# Patient Record
Sex: Male | Born: 1935 | Race: White | Hispanic: No | Marital: Married
Health system: Southern US, Community
[De-identification: ages and names within clinical notes are randomized; demographics above are authoritative.]

---

## 2004-12-13 ENCOUNTER — Ambulatory Visit: Payer: Self-pay | Admitting: Unknown Physician Specialty

## 2005-07-29 ENCOUNTER — Ambulatory Visit: Payer: Self-pay | Admitting: Ophthalmology

## 2013-01-03 ENCOUNTER — Ambulatory Visit: Payer: Self-pay | Admitting: Specialist

## 2013-01-03 LAB — BASIC METABOLIC PANEL
Anion Gap: 5 — ABNORMAL LOW (ref 7–16)
BUN: 18 mg/dL (ref 7–18)
Chloride: 105 mmol/L (ref 98–107)
Co2: 29 mmol/L (ref 21–32)
EGFR (Non-African Amer.): >60
Osmolality: 279 (ref 275–301)
Sodium: 139 mmol/L (ref 136–145)

## 2013-01-03 LAB — APTT: Activated PTT: 30.6 secs (ref 23.6–35.9)

## 2013-01-03 LAB — CBC
HCT: 39.3 % — ABNORMAL LOW (ref 40.0–52.0)
HGB: 13.1 g/dL (ref 13.0–18.0)
MCHC: 33.3 g/dL (ref 32.0–36.0)
Platelet: 174 10*3/uL (ref 150–440)
RBC: 4.22 10*6/uL — ABNORMAL LOW (ref 4.40–5.90)
RDW: 13.2 % (ref 11.5–14.5)
WBC: 6.8 10*3/uL (ref 3.8–10.6)

## 2013-01-03 LAB — URINALYSIS, COMPLETE
Bacteria: NONE SEEN
Blood: NEGATIVE
Ketone: NEGATIVE
Leukocyte Esterase: NEGATIVE
Nitrite: NEGATIVE
Protein: NEGATIVE
RBC,UR: <1 /HPF (ref 0–5)
Squamous Epithelial: <1
WBC UR: 1 /HPF (ref 0–5)

## 2013-01-03 LAB — PROTIME-INR
INR: 1
Prothrombin Time: 13.1 secs (ref 11.5–14.7)

## 2013-01-19 ENCOUNTER — Inpatient Hospital Stay: Payer: Self-pay | Admitting: Specialist

## 2013-01-20 LAB — CBC WITH DIFFERENTIAL/PLATELET
Basophil %: 0.2 %
Eosinophil #: 0 10*3/uL (ref 0.0–0.7)
Eosinophil %: 0.1 %
HCT: 34.9 % — ABNORMAL LOW (ref 40.0–52.0)
HGB: 12.1 g/dL — ABNORMAL LOW (ref 13.0–18.0)
Lymphocyte #: 0.8 10*3/uL — ABNORMAL LOW (ref 1.0–3.6)
Lymphocyte %: 5.8 %
MCH: 31.9 pg (ref 26.0–34.0)
MCV: 92 fL (ref 80–100)
Monocyte #: 1.3 x10 3/mm — ABNORMAL HIGH (ref 0.2–1.0)
Monocyte %: 9.3 %
Neutrophil #: 11.7 10*3/uL — ABNORMAL HIGH (ref 1.4–6.5)
Neutrophil %: 84.6 %
RBC: 3.8 10*6/uL — ABNORMAL LOW (ref 4.40–5.90)
RDW: 12.9 % (ref 11.5–14.5)
WBC: 13.9 10*3/uL — ABNORMAL HIGH (ref 3.8–10.6)

## 2013-01-20 LAB — BASIC METABOLIC PANEL
Anion Gap: 6 — ABNORMAL LOW (ref 7–16)
BUN: 13 mg/dL (ref 7–18)
Calcium, Total: 8.3 mg/dL — ABNORMAL LOW (ref 8.5–10.1)
Co2: 27 mmol/L (ref 21–32)
Creatinine: 0.89 mg/dL (ref 0.60–1.30)
EGFR (Non-African Amer.): >60
Glucose: 138 mg/dL — ABNORMAL HIGH (ref 65–99)
Osmolality: 271 (ref 275–301)
Sodium: 134 mmol/L — ABNORMAL LOW (ref 136–145)

## 2013-01-21 LAB — BASIC METABOLIC PANEL
Anion Gap: 8 (ref 7–16)
BUN: 13 mg/dL (ref 7–18)
Calcium, Total: 8.9 mg/dL (ref 8.5–10.1)
Chloride: 100 mmol/L (ref 98–107)
Co2: 27 mmol/L (ref 21–32)
Creatinine: 0.94 mg/dL (ref 0.60–1.30)
EGFR (African American): >60
Glucose: 96 mg/dL (ref 65–99)
Osmolality: 270 (ref 275–301)
Potassium: 3.8 mmol/L (ref 3.5–5.1)
Sodium: 135 mmol/L — ABNORMAL LOW (ref 136–145)

## 2013-01-22 LAB — BASIC METABOLIC PANEL
BUN: 11 mg/dL (ref 7–18)
Chloride: 104 mmol/L (ref 98–107)
Co2: 25 mmol/L (ref 21–32)
Creatinine: 0.89 mg/dL (ref 0.60–1.30)
EGFR (African American): >60
Potassium: 3.7 mmol/L (ref 3.5–5.1)
Sodium: 137 mmol/L (ref 136–145)

## 2013-01-22 LAB — CBC WITH DIFFERENTIAL/PLATELET
Basophil #: 0.1 10*3/uL (ref 0.0–0.1)
Basophil %: 0.8 %
MCH: 31.7 pg (ref 26.0–34.0)
MCHC: 34.4 g/dL (ref 32.0–36.0)
MCV: 92 fL (ref 80–100)
Monocyte %: 10.2 %
Neutrophil %: 72.6 %
RDW: 13.4 % (ref 11.5–14.5)
WBC: 10.6 10*3/uL (ref 3.8–10.6)

## 2013-01-25 LAB — PATHOLOGY REPORT

## 2013-05-04 ENCOUNTER — Ambulatory Visit: Payer: Self-pay | Admitting: Specialist

## 2013-05-04 LAB — PROTIME-INR: Prothrombin Time: 13.1 secs (ref 11.5–14.7)

## 2013-05-04 LAB — BASIC METABOLIC PANEL
Calcium, Total: 9.1 mg/dL (ref 8.5–10.1)
Chloride: 106 mmol/L (ref 98–107)
Creatinine: 0.86 mg/dL (ref 0.60–1.30)
EGFR (African American): >60
EGFR (Non-African Amer.): >60
Glucose: 91 mg/dL (ref 65–99)
Osmolality: 276 (ref 275–301)
Sodium: 138 mmol/L (ref 136–145)

## 2013-05-04 LAB — URINALYSIS, COMPLETE
Bacteria: NONE SEEN
Blood: NEGATIVE
Glucose,UR: NEGATIVE mg/dL (ref 0–75)
Ketone: NEGATIVE
Leukocyte Esterase: NEGATIVE
Nitrite: NEGATIVE
Specific Gravity: 1.014 (ref 1.003–1.030)
WBC UR: <1 /HPF (ref 0–5)

## 2013-05-04 LAB — CBC
HCT: 39.4 % — ABNORMAL LOW (ref 40.0–52.0)
HGB: 13.5 g/dL (ref 13.0–18.0)
MCHC: 34.2 g/dL (ref 32.0–36.0)
Platelet: 187 10*3/uL (ref 150–440)

## 2013-05-05 ENCOUNTER — Ambulatory Visit: Payer: Self-pay | Admitting: Specialist

## 2013-05-05 LAB — MRSA PCR SCREENING

## 2013-05-06 LAB — MRSA PCR SCREENING

## 2013-05-10 ENCOUNTER — Inpatient Hospital Stay: Payer: Self-pay | Admitting: Specialist

## 2013-05-11 LAB — CBC WITH DIFFERENTIAL/PLATELET
Basophil #: 0.1 10*3/uL (ref 0.0–0.1)
Basophil %: 0.4 %
Eosinophil #: 0.2 10*3/uL (ref 0.0–0.7)
Eosinophil %: 1.4 %
HCT: 30.5 % — ABNORMAL LOW (ref 40.0–52.0)
HGB: 10.4 g/dL — ABNORMAL LOW (ref 13.0–18.0)
Lymphocyte #: 0.9 10*3/uL — ABNORMAL LOW (ref 1.0–3.6)
MCH: 31.1 pg (ref 26.0–34.0)
MCV: 91 fL (ref 80–100)
Monocyte #: 1 x10 3/mm (ref 0.2–1.0)
Neutrophil #: 10.8 10*3/uL — ABNORMAL HIGH (ref 1.4–6.5)
Neutrophil %: 83.5 %
Platelet: 139 10*3/uL — ABNORMAL LOW (ref 150–440)
RBC: 3.36 10*6/uL — ABNORMAL LOW (ref 4.40–5.90)
RDW: 13.7 % (ref 11.5–14.5)

## 2013-05-11 LAB — BASIC METABOLIC PANEL
Anion Gap: 5 — ABNORMAL LOW (ref 7–16)
BUN: 13 mg/dL (ref 7–18)
Calcium, Total: 8.1 mg/dL — ABNORMAL LOW (ref 8.5–10.1)
Chloride: 101 mmol/L (ref 98–107)
Co2: 26 mmol/L (ref 21–32)
Creatinine: 0.91 mg/dL (ref 0.60–1.30)
EGFR (Non-African Amer.): >60
Osmolality: 267 (ref 275–301)
Potassium: 3.9 mmol/L (ref 3.5–5.1)
Sodium: 132 mmol/L — ABNORMAL LOW (ref 136–145)

## 2013-05-12 DIAGNOSIS — R000 Tachycardia, unspecified: Secondary | ICD-10-CM

## 2013-05-12 LAB — URINALYSIS, COMPLETE
Bacteria: NONE SEEN
Bilirubin,UR: NEGATIVE
Glucose,UR: NEGATIVE mg/dL (ref 0–75)
Leukocyte Esterase: NEGATIVE
Protein: 30
Specific Gravity: 1.012 (ref 1.003–1.030)
WBC UR: 3 /HPF (ref 0–5)

## 2013-05-12 LAB — TSH: Thyroid Stimulating Horm: 1.44 u[IU]/mL

## 2013-05-12 LAB — PATHOLOGY REPORT

## 2013-05-12 LAB — HEMOGLOBIN: HGB: 11.3 g/dL — ABNORMAL LOW (ref 13.0–18.0)

## 2013-05-13 LAB — BASIC METABOLIC PANEL
Anion Gap: 7 (ref 7–16)
BUN: 10 mg/dL (ref 7–18)
Calcium, Total: 8 mg/dL — ABNORMAL LOW (ref 8.5–10.1)
Co2: 24 mmol/L (ref 21–32)
Creatinine: 0.89 mg/dL (ref 0.60–1.30)
EGFR (African American): >60
EGFR (Non-African Amer.): >60
Glucose: 108 mg/dL — ABNORMAL HIGH (ref 65–99)
Potassium: 3.5 mmol/L (ref 3.5–5.1)
Sodium: 140 mmol/L (ref 136–145)

## 2014-09-20 ENCOUNTER — Encounter
Admit: 2014-09-20 | Disposition: A | Discharge status: Home / Self Care | Payer: Self-pay | Attending: Neurology | Admitting: Neurology

## 2014-10-20 ENCOUNTER — Encounter
Admit: 2014-10-20 | Disposition: A | Discharge status: Home / Self Care | Payer: Self-pay | Attending: Neurology | Admitting: Neurology

## 2014-11-10 NOTE — Discharge Summary (Signed)
PATIENT NAME:  Chad Adams, Kolsen H MR#:  [id number] DATE OF BIRTH:  1926/06/16  DATE OF ADMISSION:  01/19/2013 DATE OF DISCHARGE:  01/24/2013  FINAL DIAGNOSES: 1.  Advanced osteoarthritis, right hip. 2.  High cholesterol. 3.  Hearing problems.  4.  History of prostate cancer.   OPERATION: On 01/19/2013, cementless DePuy AML right total hip replacement.   COMPLICATIONS: None.   CONSULTATIONS: [facility] document.   DISCHARGE MEDICATIONS: 1.  Lipitor 10 mg daily. 2.  Neurontin 400 mg b.i.d.  3.  Enteric-coated aspirin 1 p.o. b.i.d. 4.  Iron 1 p.o. daily.  5.  Triamcinolone cream to legs and groin p.r.n.   HISTORY OF PRESENT ILLNESS: The patient is an 81 year old male who developed progressive severe arthritis of the right hip. He no longer received relief with anti-inflammatory medication and pain pills. He had tried rest, exercise and a cane. He had trouble with daily activities and sleep. He wished to proceed with right total hip replacement after thorough discussion of the operative procedure and risks and benefits with the patient and his wife.   DIAGNOSTICS:  X-ray showed severe arthritis of the hip with sclerosis and cyst formation.   PAST MEDICAL HISTORY: As above.   MEDICATIONS: As above.   ALLERGIES: NO KNOWN DRUG ALLERGIES.   REVIEW OF SYSTEMS:  Unremarkable.   FAMILY HISTORY: Unremarkable.   SOCIAL HISTORY: The patient does not smoke or drink. He lives at home with his wife.  PHYSICAL EXAMINATION: The patient's left hip had good motion with mild decrease in rotation. There was minimal pain. The right hip had severe pain with motion. There was limited internal rotation and about 20 degrees external rotation. Leg lengths were essentially equal. Neurovascular status was good distally.   LABORATORY DATA: On admission was satisfactory.   HOSPITAL COURSE: On 01/19/2013, the patient underwent right total hip replacement. He did well postoperatively. He had a little bit of  lightheadedness on the second postop day and was seen by the medical consult. He was given some IV fluids. His hemoglobin remained quite stable and was 11.4 on the third postop day. The patient made good progress with PT. His discharge was delayed by the insurance company being closed. He is stable and ready for skilled nursing transfer. He will be seen in my office in 2 weeks for exam and x-rays and staple removal.   01/25/13  Addendum:  [employer] insurance would not approve skilled nursing care for this patient even though he is 81 years old.  He is to be discharged to home with home health PT.     ____________________________ Valinda HoarHoward E. Alania Overholt, MD hem:sb D: 01/24/2013 13:25:00 ET T: 01/24/2013 14:02:49 ET JOB#: [id number]  cc: Valinda HoarHoward E. Deniss Wormley, MD, <Dictator> Jorje GuildGlenn R. Beckey DowningWillett, MD Valinda HoarHOWARD E Jasai Sorg MD ELECTRONICALLY SIGNED 01/25/2013 15:02

## 2014-11-10 NOTE — Consult Note (Signed)
PATIENT NAME:  Chad Adams, Chad Adams MR#:  [id number] DATE OF BIRTH:  10/20/1925  DATE OF CONSULTATION:  05/12/2013  REFERRING PHYSICIAN:  Dr. Hyacinth Meeker CONSULTING PHYSICIAN:  Gilmar Bua P. Juliene Pina, MD  REASON FOR CONSULTATION: Presyncope with tachycardia.   PRIMARY CARE PHYSICIAN: Barry Brunner, MD  PRIMARY CARDIOLOGIST:  Harold Hedge, MD  IMPRESSION: 1.  Presyncope with nausea upon standing up.  2.  Sinus tachycardia.  3.  Postoperative day number 2 status post total hip replacement.  4.  History of hyperlipidemia.   HISTORY OF PRESENT ILLNESS: This is a very pleasant 81 year old male with a history of osteoarthritis and hyperlipidemia who is postop day number 2 for a total hip replacement. According to the patient, yesterday he walked with physical therapy and did well. He walked from his bed to the door. However, this morning when he got up to walk with physical therapy he got to the door and felt very nauseous and thought he was going to pass out. They took him back to his chair where he felt better, but it is noted that he has also been tachycardiac. It also has been noted that yesterday at approximately midnight, on the 22nd, he had a fever. However, since then, he has not had a fever. The patient denies any chest pain, shortness of breath.   REVIEW OF SYSTEMS: CONSTITUTIONAL: A fever, as mentioned above, but no chills, weight loss, weight gain. He did have dizziness today, but not prior. EYES: No blurred or double vision or swelling.  ENT: No tinnitus, difficulty swallowing.  CARDIOVASCULAR: No chest pain, orthopnea, paroxysmal dyspnea, shortness of breath or arrhythmia.  RESPIRATORY: No cough, wheezing or hemoptysis. No shortness of breath.  GASTROINTESTINAL: He felt nauseous this morning with the episode, but no vomiting, diarrhea, abdominal pain, melena or ulcers.  GENITOURINARY: No dysuria or hematuria.  ENDOCRINE: No polyuria, polydipsia, heat or cold intolerance.  MUSCULOSKELETAL: He does  have arthritis.  NEUROLOGIC: No history of CVA, TIA, seizure or stroke. No numbness or tingling.  PSYCH: No anxiety or depression.  SKIN: No rash or lesions.  PAST MEDICAL HISTORY:  1.  Mild COPD. 2.  Hyperlipidemia.  3.  BPH.  4.  Prostate cancer.  5.  Lung nodule.   ALLERGIES: No known drug allergies.   MEDICATIONS: 1.  Lipitor 20 mg daily.  2.  Aspirin 81 mg daily.   PAST SURGICAL HISTORY:  1.  Right hip and now left hip replacement.  2.  Pilonidal cyst. 3.  Right hand surgery.  4.  Tonsillectomy.   FAMILY HISTORY: Positive for breast cancer and throat cancer as well as hypertension.  SOCIAL HISTORY: The patient used to smoke but has not smoked for over 40 years. No alcohol or IV drug use. He lives with his wife.   PHYSICAL EXAMINATION: VITAL SIGNS: Temperature 98.1, pulse 113 to 120, respirations 18, blood pressure 155 to 163 over 75 to 84, 95% on room air.  GENERAL: The patient is alert and oriented, not in acute distress.  HEENT: Head is atraumatic. Sclerae anicteric. Mucous membranes are moist. Oropharynx is clear. He does wearing aids. NECK: Supple without JVD, carotid bruit or enlarged thyroid. HEART: Tachycardia without murmur, gallops or rubs. PMI is nondisplaced.   LUNGS: Clear to auscultation without crackles, rales, rhonchi or wheezing. Normal to percussion. Normal respiratory effort.  ABDOMEN: Bowel sounds are positive, nontender and nondistended. No hepatosplenomegaly.  EXTREMITIES: No clubbing, cyanosis or edema. His feet are cold, however, he has good pulses bilaterally.  MUSCULOSKELETAL:  The patient is able to move all extremities. BACK: No CVA or vertebral tenderness.   LABORATORY AND DIAGNOSTICS: White blood cells 13, hemoglobin 10.4, hematocrit 31, platelets 139. Sodium 132, potassium 2.9, chloride 101, bicarb 26, BUN 13, creatinine 0.91, glucose 131 and calcium is 8.1.   EKG performed today showed sinus tachycardia with a heart rate of 114.    ASSESSMENT AND PLAN: This is an 81 year old male who is postop day number 2 who describes presyncope.  1.  Presyncope. Possibly related to his tachycardia with possible dehydration or related to the spinal anesthesia or related to the pain medications. For the presyncope, I recommend trying gentle bolus with 250 mL. Will increase the IV fluids to 125 and change it to normal saline. Place the patient on off-unit telemetry. He does not have any history of coronary artery disease or congestive heart failure so at this time I will not obtain an echocardiogram. However, if he continues to be tachycardiac or have presyncopal symptoms, we may consider this. I will also check orthostatics.  2.  Tachycardia. Likely secondary to hypovolemia with some intravascular volume depletion versus spinal anesthesia. Will go ahead and try IV fluids to see if this helps control his pain, if the patient is having any pain, but currently he is not complaining of any pain and monitor closely and check a TSH.  3.  Elevated blood pressure without history of hypertension. Per the wife, the patient has always had hypotension. Again, the patient is not complaining of pain but this could be elevated from pain or from the anesthesia. Will need to monitor closely.  4.  Fever. The patient had a fever at midnight on October 22nd. No fever since then. However, due to his tachycardia, we will go ahead and check for any occult infection with a UA and chest x-ray.  5.  Hyperlipidemia. Will continue Lipitor.   The patient is FULL CODE status.   Thank you for allowing us to participate in the care of this patient. We will continue to follow.  TIME SPENT ON CONSULTATION: 50 minutes. ____________________________ Janyth ContesSital P. Juliene PinaMody, MD spm:sb D: 05/12/2013 10:52:55 ET T: 05/12/2013 11:06:54 ET JOB#: [id number]  cc: Inaya Gillham P. Juliene PinaMody, MD, <Dictator> Janyth ContesSITAL P Moksha Dorgan MD ELECTRONICALLY SIGNED 05/12/2013 11:37

## 2014-11-10 NOTE — Op Note (Signed)
PATIENT NAME:  Chad Adams, Chad Adams MR#:  [id number] DATE OF BIRTH:  1926-04-23  DATE OF PROCEDURE:  05/10/2013  PREOPERATIVE DIAGNOSIS: Advanced osteoarthritis, left hip.  POSTOPERATIVE DIAGNOSIS: Advanced osteoarthritis, left hip.  OPERATION: DePuy cementless AML total hip replacement (16.5 mm femoral stem, 56 mm series 300 cup with a 36 mm +4 liner with 10 degree lip, 36 mm chrome cobalt head, +5 mm neck length).  SURGEON: Deeann SaintHoward Maleeyah Mccaughey, MD.  ASSISTANT: Dr. Martha ClanKrasinski.  ANESTHESIA: Spinal.  COMPLICATIONS: None.  DRAINS: Two Autovacs.   ESTIMATED BLOOD LOSS: 500 mL.  REPLACEMENTS: None.  OPERATIVE PROCEDURE: The patient was brought to the operating room where he underwent satisfactory spinal anesthesia and was placed in the right lateral decubitus position on the operating room table and padded appropriately. Preoperative leg lengths appeared to be fairly equal. The left hip was prepped and draped in a sterile fashion and a posterolateral incision made. Dissection was carried out sharply through subcutaneous tissue and fascia and a Charnley retractor inserted. Soft tissue dissection was carried out and the piriformis was tagged. Posterior capsule was opened in a T fashion and tagged. A Steinmann pin was placed in the pelvis above the hip and bent and a suture placed on the greater trochanter to mark leg lengths. The hip was then dislocated and the femoral neck amputated with an oscillating saw. The protractors were inserted to expose the acetabulum and labrum was removed. He had extremely thick, robust labrum. The soft tissues were removed from the fossa. The fossa was sequentially reamed from 51 to 55 mm. This brought us quite close to the inner table. The 56 mm trial liner was inserted and fit snuggly. A +4 neutral liner was inserted.   The femoral canal was then opened with a canal finder and reamed sequentially to 16 mm. It was then rasped starting with 13.5 narrow going up to 16.5 narrow  rasp. The trial reduction was then carried out using +1.5 neck length with a 36 mm head. This allowed a little bit too much pistoning and it was slightly unstable. The acetabular liner was changed to one with a 10 degree lip posteriorly and a +5 neck length was used and this provided much better stability and alignment. The Steinmann pin showed that the length was excellent. The remaining trials were removed and after irrigation, the hole eliminator was inserted into the cup. A +4 liner with a 10 mm lip was placed with the lip posteriorly and superiorly. A 16.5 mm narrow femoral stem was inserted and was quite snug. A 36 mm head with a +5 neck length was attached and the hip was reduced. Stability was excellent. Leg lengths were excellent. The Steinmann pin was removed. The wound was irrigated. The capsule was closed with #2 Ticron. The piriformis was repaired with similar suture. The capsule was then closed with #2 Manson AllanQuill over an Autovac drain and the subcutaneous tissue was close with 0 Quill over another Autovac drain with irrigation at each level. The patient's skin was closed with staples.TENS pads were applied. A dry sterile dressing was applied and the Autovac was activated. The patient was carefully transferred into the supine position and then transferred to his hospital bed. The hip was stable and leg lengths were excellent. He was taken to recovery in good condition.   ____________________________ Valinda HoarHoward E. Skylur Fuston, MD hem:aw D: 05/10/2013 10:31:24 ET T: 05/10/2013 10:45:18 ET JOB#: [id number]  cc: Valinda HoarHoward E. Torina Ey, MD, <Dictator> Valinda HoarHOWARD E Hadyn Blanck MD ELECTRONICALLY SIGNED 05/10/2013 11:59

## 2014-11-10 NOTE — Op Note (Signed)
PATIENT NAME:  Chad Adams, Chad Adams MR#:  [id number] DATE OF BIRTH:  04-03-1926  DATE OF PROCEDURE:  01/19/2013  PREOPERATIVE DIAGNOSIS: Advanced osteoarthritis, right hip.   POSTOPERATIVE DIAGNOSIS: Advanced osteoarthritis, right hip.   PROCEDURE PERFORMED: Cementless right total hip replacement with DePuy AML prosthesis (15 mm narrow femoral stem, 52 mm Pinnacle 300 series cup, neutral +4 polyethylene liner, 36 mm head with +1.5 mm neck length).   SURGEON: Valinda HoarHoward E. Djuan Talton, MD   ASSISTANT: Kathreen DevoidKevin L. Krasinski, MD   ANESTHESIA: Spinal.   COMPLICATIONS: None.   DRAINS: Two Autovacs.   ESTIMATED BLOOD LOSS: 250 mL.  REPLACED: None.   DESCRIPTION OF PROCEDURE: The patient was brought to the Operating Room where he underwent satisfactory spinal anesthesia and was placed in the left lateral decubitus position and padded appropriately on the operating room table. The right hip was prepped and draped in sterile fashion. A posterior lateral incision was made and dissection carried out sharply through subcutaneous tissue and fascia. A Charnley retractor was inserted. The short external rotators were divided and tagged, and the posterior capsule was divided and tagged. The femoral head was dislocated and the centering hole made at the top of the neck. A canal finder was introduced, and the canal was then reamed to 14.5 mm.  The femoral head was then amputated with an oscillating saw, and the canal was packed with vaginal packing to reduce bleeding. The femur was retracted out of the way, and the acetabulum was debrided. The patient had a very thick extensive labrum which was removed. The soft tissue was cauterized and removed from the fovea. The acetabulum was then reamed sequentially up to 55 mm.  A 56 mm trial cup was inserted and fit very snugly in good position. This was removed, and the acetabulum was irrigated, and a 56 mm Pinnacle 3-spike 300 series cup was inserted in approximately 45 degrees of  abduction and 20 degrees of anteversion. This fit very snugly and bottomed out nicely. A trial neutral +4 liner was inserted. The femoral canal was then rasped up to 15 mm and a calcar reamer used to smooth off the neck calcar. A trial reduction was carried out with a 36 mm +1.5 neck length head. This reduced well. There was no pistoning. The leg lengths were equal. Prior to dislocating the original femoral head, a Steinmann pin had been placed in the acetabulum and bent over to the greater trochanter and a suture placed to lock the original length in place. The Steinmann pin was then used to ascertain that the leg lengths remained equal. The trials were removed. The wound was irrigated. A hole eliminator was placed in the cup, and a neutral +4 polyethylene liner was inserted and seated snugly. A 15 mm narrow AML stem was then inserted snugly. The trial reduction was again carried out with a 36 mm head and it again seemed appropriate. This was removed, and the 36 mm metal head with a +1.5 neck length was inserted. The hip was reduced for a final time, and he had excellent range of motion with good stability. There was no pistoning.  Leg lengths were good. The wound was irrigated. The posterior capsule was closed with #2 Ticron. The short external rotators were repaired with the same suture.  After irrigation, the capsule was closed with #2 Quill suture over an Autovac drain, and the subcutaneous tissue was closed with 0 Quill over another Autovac drain. The skin was closed with staples, and a dry sterile  dressing was applied. The Autovac was hooked up, and the patient was transferred to his hospital bed. The hip was stable with good orientation and good leg length. He was taken to recovery in good condition.  ____________________________ Valinda Hoar, MD hem:cb D: 01/19/2013 15:50:09 ET T: 01/19/2013 20:18:39 ET JOB#: [id number]  cc: Valinda Hoar, MD, <Dictator> Valinda Hoar MD ELECTRONICALLY SIGNED  01/20/2013 7:45

## 2014-11-10 NOTE — Discharge Summary (Signed)
PATIENT NAME:  Chad Adams, Chad Adams MR#:  [id number] DATE OF BIRTH:  [birth year]  DATE OF ADMISSION:  05/10/2013 DATE OF DISCHARGE:  05/14/2013  FINAL DIAGNOSES:  1.  Advanced osteoarthritis of the left hip. 2.  High cholesterol. 3.  Hearing problems.   OPERATION: 05/10/13, cementless AML left total hip replacement.   COMPLICATIONS: None.   CONSULTATION: [facility] doc for tachycardia.   DISCHARGE MEDICATIONS: Mobic 50 mg daily, Lipitor 10 mg daily, enteric-coated aspirin one p.o. b.i.d., Norco 5/325 mg q.6 hours p.r.n. pain.   HISTORY OF PRESENT ILLNESS: The patient is an 81 year old male with advanced osteoarthritis of the left hip, which bothers him constantly and has constant pain. He is unable to sleep well and has difficulty with daily activities. He has failed treatment with NSAIDs and exercises after years of treatment. He had a right total hip replacement in July, which has greatly relieved his  pain. He is eager to have the left one replaced. The risks and benefits were discussed with the patient and his wife. The x-rays do show advanced osteoarthritis with complete joint loss, spurring, sclerosis and cyst formation.   PAST MEDICAL HISTORY: Illnesses: As above.   ALLERGIES: NEURONTIN.   FAMILY HISTORY: Unremarkable.   SOCIAL HISTORY: The patient does not smoke or drink and lives with his wife at home.   REVIEW OF SYSTEMS: Otherwise unremarkable.   PHYSICAL EXAMINATION: The patient was alert and cooperative. Vital signs were normal. Right hip has good motion and is stable. Left hip has severe pain with movement. He has 10 degrees internal rotation and 20 degrees external rotation with pain. Leg lengths are almost equal. Neurovascular status was good distally and the skin was intact.   LABORATORY DATA: On admission was satisfactory.   HOSPITAL COURSE: On 05/10/13, the patient underwent AML cementless total hip replacement. He did well. He did have some tachycardia when ambulating and  was seen by [facility] doc service and was felt to be hypovolemic and was treated with fluids. He did well after that. He was stable and ready for discharge on 05/14/13. Home health PT was arranged. He will be partial weight bear with a walker and be seen in my office in 2 weeks for exam and x-ray.  ____________________________ Valinda HoarHoward E. Braydyn Schultes, MD hem:aw D: 05/26/2013 10:23:17 ET T: 05/26/2013 10:43:17 ET JOB#: [id number]  cc: Valinda HoarHoward E. Deem Marmol, MD, <Dictator> Jorje GuildGlenn R. Beckey DowningWillett, MD Valinda HoarHOWARD E Loral Campi MD ELECTRONICALLY SIGNED 05/26/2013 11:32

## 2014-11-10 NOTE — Consult Note (Signed)
PATIENT NAME:  Chad Adams, Chad Adams MR#:  [id number] DATE OF BIRTH:  1926/05/26  DATE OF CONSULTATION:  01/21/2013  REFERRING PHYSICIAN:  Dr. Martha Adams, covering for Dr. Deeann SaintHoward Adams CONSULTING PHYSICIAN:  Chad Furnaceoberto Sanchez Gutierrez, MD  CHIEF COMPLAINT: Status post right total hip replacement. The patient had a presyncopal episode on postoperative day #2.   HISTORY OF PRESENT ILLNESS: Mr. Chad Adams is a very nice 81 year old gentleman who has a history of osteoarthritis. The patient is a patient of Dr. Barry BrunnerGlenn Adams and he has been seeing him there for a history of mild COPD, hyperlipidemia, BPH, urinary tract infections in the past, colonic polyps, history of syncope in the past, tiny right middle lobe nodule and prostate cancer for what he has multiple seeds in his prostate. The patient has history of osteoarthritis that has been going on for a long time and he decided to go into surgical management as he failed his medical management with conservative treatment. The patient was using NSAIDs, exercise, and did not get any better. The patient comes on 01/19/2013 for surgical management. He underwent a right total hip replacement with DePuy prosthesis without any major complications under spinal anesthesia. The patient did well on postoperative day #1 and on postoperative day #2, the patient sat up on the side of the bed really quickly, tried to get up and he has almost passed out. Everything went black for a second and the patient almost fell. He denies any chest pain or other problems at this moment. He is actually feeling back to his normal self. He states that the episode happened very quickly and he just felt dizzy, lightheaded a little bit after that. After he received  250 mL of fluids and his fluid basal was increased to 125, he was able to actually get up and walk around the nursing station without any problems.   REVIEW OF SYSTEMS: A 12-system review of systems is done.  CONSTITUTIONAL: The patient  denies any fever, chills, weight loss, weight gain, fatigue, dizziness or lightheadedness prior to today.  EYES: No blurry vision, double vision or swelling.  EARS, NOSE, THROAT: No tinnitus. No difficulty swallowing.  CARDIOVASCULAR: No chest pain, orthopnea or paroxysmal nocturnal dyspnea.  RESPIRATORY: No cough, sputum or hemoptysis. The patient has a history of mild COPD but is well-controlled.  GASTROINTESTINAL: No nausea, vomiting, abdominal pain, constipation, diarrhea.  GENITOURINARY: No dysuria or changes in frequency. Positive nycturia due to his BPH.  MUSCULOSKELETAL: Positive for osteoarthritis and occasionally cramps, right hip pain.  NEUROLOGIC: No numbness, tingling. No CVAs or TIAs. ENDOCRINE: No polyuria, polydipsia, polyphagia, cold or heat intolerance.  PSYCHIATRIC: No significant anxiety or depression.  LYMPHATICS: Negative for masses or swollen glands.  INTEGUMENTARY: No skin rashes or lesions.   PAST MEDICAL HISTORY: 1.  Mild COPD.  2.  Hyperlipidemia.  3.  Urinary tract infections.  4.  BPH.  5.  Prostate cancer.  6.  Colon polyps.  7.  Hard of hearing.  8.  Lung nodule.   ALLERGIES: No known drug allergies.   PAST SURGICAL HISTORY: 1.  Status post right total hip replacement.  2.  Pilonidal cyst.  3.  Right hand surgery.  4.  Tonsillectomy.   MEDICATIONS: Lipitor 20 mg once daily, aspirin 81 mg once daily,  FAMILY HISTORY: Positive for breast cancer in his mom, throat cancer in his son, hypertension in his parents.   SOCIAL HISTORY: The patient used to be a heavy smoker but has not smoked for the last  40 years. Denies any drinking. No IV drug abuse. He is retired.   PHYSICAL EXAMINATION: VITAL SIGNS: Blood pressure of 91/52, pulse of 102 to 114, respirations around 18, temperature 97.8. GENERAL: Alert, oriented x3. No acute distress. No respiratory distress. Hemodynamically stable.  HEENT: Pupils are equal and reactive. Extraocular movements are  intact. Mucosae are moist. Anicteric sclerae. Pink conjunctivae. No oral lesions. No oropharyngeal exudates.  NECK: Supple. No JVD. No thyromegaly. No adenopathy. No carotid bruits. No rigidity.  CARDIOVASCULAR: Regular rate and rhythm. No murmurs, rubs or gallops. No displacement of PMI. No tenderness to palpation of anterior chest wall.  LUNGS: Clear without any wheezing or crepitus. No use of accessory muscles.  ABDOMEN: Soft, nontender, nondistended. No hepatosplenomegaly. No masses. Bowel sounds are positive. No rebound.  GENITAL: Negative for external lesions.  EXTREMITIES: No edema, cyanosis or clubbing. Pulses +2. Capillary refill less than 3.  MUSCULOSKELETAL: No deformity at the level of the right hip. Incision is covered. No swelling. No hematomas. Range of motion of the lower extremities seems to be appropriate for postoperative. No swollen joints.  SKIN: No rashes, petechiae.  VASCULAR: Good pulses, +2. Capillary refill less than 3.  NEUROLOGIC: Cranial nerves II through XII intact.  Strength seems to be appropriate and equal in 4 extremities.  LYMPHATIC: Negative for lymphadenopathy in neck or supraclavicular areas.  PSYCHIATRIC: Negative for agitation, anxiety.   LABORATORY AND DIAGNOSTIC DATA: Glucose 96, BUN 13, sodium 135. Hemoglobin 12.2. White count on admission was 13.9 and platelet count 165.   EKG: Sinus tachycardia today, prior to that normal sinus rhythm.   ASSESSMENT AND PLAN: An 81 year old gentleman with history of chronic obstructive pulmonary disease, hyperlipidemia, hard of hearing, colon polyps,  and prostate cancer, comes to total hip replacement of the right hip.  1.  Total hip replacement of the right hip: The patient underwent surgery under spinal anesthesia, doing great. Evaluation by orthopedic surgeon, seems like everything is going on as planned.  2.  Presyncope, orthostatic hypotension and general postoperative hypotension: This is related to  multifactorial things: (1) Intravascular volume depletion. (2) Spinal anesthesia with vasodilation of lower extremity circulation and not able to get muscle contraction for return of blood to the heart. (3) Also pain medications. At this moment the patient is stable, back to normal. Recommendations given as far as how to move around from supine to sitting and from sitting to standing with patience. Do it slowly. Until you are sure that you can walk, do not do any first steps. The patient has been given intravenous fluids, a bolus of 250 and then followed by 125 an hour. He does not have any history of coronary artery disease or congestive heart failure. At this moment, we are going to run the fluids for 10 hours and then change it back to 125.  As far as his other medical problems, they are stable. We are going to continue to monitor  closely.   TIME SPENT: Full evaluation of the patient, chart review and discussion with orthopedic surgeon done. I spent about 45 minutes with this patient today.   ____________________________ Chad Furnace, MD [email or web address] D: 01/21/2013 13:31:15 ET T: 01/21/2013 14:28:41 ET JOB#: [id number]  cc: Chad Furnace, MD, <Dictator> Jakiera Ehler Juanda Chance MD ELECTRONICALLY SIGNED 01/30/2013 13:24

## 2014-11-10 NOTE — Consult Note (Signed)
Neurontin: Rash, Itching   Impression presyncope  tachycardia fever   Plan EKG sinus tachy likley hypovolemia vs anasthesia vs infection check UA, CXR TSH try bolus fluids increase to 125 cc/hr orthostatics  thank yuo will follow   Electronic Signatures: Adrian SaranMody, Mckaylee Dimalanta (MD)  (Signed 23-Oct-14 10:42)  Authored: Allergies, Impression/Plan   Last Updated: 23-Oct-14 10:42 by Adrian SaranMody, Chelsae Zanella (MD)

## 2014-11-10 NOTE — H&P (Signed)
   Subjective/Chief Complaint Pain left hip   History of Present Illness 81 year old male has advanced osteoarthritis left hip which interferes with all activities of daily living and sleep.  Failed NSAIDs and exercise after years of treatment.  Had right total hip replacement in July and is very eager to get left hip done to relieve pain.  Risks and benefits of surgery were discussed at length including but not limited to infection, non union, nerve or blood vessed damage, non union, need for repeat surgery, blood clots and lung emboli, and death.  X-rays show severe osteoarthritis with spurs. cysts, and sclerosis.   Past Medical Health High cholesterol.  Hearing problems   Primary Physician [facility]   ALLERGIES:  Neurontin: Rash, Itching  HOME MEDICATIONS: Medication Instructions Status  Aleve sodium 220 mg oral tablet 1 tab(s) orally every 8 hours, As Needed - for Pain Active  Mobic 15 mg oral tablet 1 tab(s) orally once a day Active  Lipitor 10 mg oral tablet 1 tab(s) orally once a day (in the morning) Active   Family and Social History:  Family History Non-Contributory   Social History negative tobacco, negative ETOH   Place of Living Home   Review of Systems:  Fever/Chills No   Cough No   Sputum No   Abdominal Pain No   Physical Exam:  GEN well developed, well nourished, no acute distress   HEENT pink conjunctivae   NECK supple   RESP normal resp effort   CARD regular rate   ABD denies tenderness   LYMPH negative neck   EXTR negative edema, Left hip range of motion painful and decreased to 10* internal rotation and 20* external rotation.  leg lengths equal.  circulation/sensation/motor function good.   SKIN normal to palpation   NEURO motor/sensory function intact   PSYCH alert, A+O to time, place, person, good insight    Assessment/Admission Diagnosis Advanced osteoarthritis left hip   Plan Left hip total hip replacement   Electronic  Signatures: Valinda HoarMiller, Jasia Hiltunen E (MD)  (Signed 20-Oct-14 18:34)  Authored: CHIEF COMPLAINT and HISTORY, ALLERGIES, HOME MEDICATIONS, FAMILY AND SOCIAL HISTORY, REVIEW OF SYSTEMS, PHYSICAL EXAM, ASSESSMENT AND PLAN   Last Updated: 20-Oct-14 18:34 by Valinda HoarMiller, Tammee Thielke E (MD)

## 2014-11-10 NOTE — H&P (Signed)
   Subjective/Chief Complaint Pain right hip.   History of Present Illness 81 year old male with advanced right hip osteoarthritis who has failed conservative treatment with NSAIDs, exercise, rest.  X-rays show severe joint narrowing, cysts, and sclerosis.  He has pain with motion, ambulation, and night pain.  Requests total hip replacement.   Past Medical Health cholesterol, hearing, prostate   Primary Physician [facility]   ALLERGIES:  No Known Allergies:   HOME MEDICATIONS: Medication Instructions Status  Lipitor 10 mg oral tablet 1 tab(s) orally once a day (in the morning) Active  Mobic 15 mg oral tablet 1 tab(s) orally once a day Active  Aleve sodium 220 mg oral tablet 1 tab(s) orally every 8 hours, As Needed - for Pain Active   Family and Social History:  Family History Non-Contributory   Social History negative tobacco, negative ETOH   Place of Living Home   Review of Systems:  Fever/Chills No   Cough No   Sputum No   Abdominal Pain No   Physical Exam:  GEN well developed, well nourished, no acute distress   HEENT pink conjunctivae   NECK supple   RESP normal resp effort   CARD regular rate   ABD denies tenderness   GU right hip pain with range of motion,  circulation/sensation/motor function good distally, 20* internal rotation, 30* ext rotation, leg length about equal.   LYMPH negative neck   EXTR negative edema   SKIN normal to palpation   NEURO motor/sensory function intact   PSYCH alert, A+O to time, place, person, good insight    Assessment/Admission Diagnosis osteoarthritis right hip   Plan right total hip replacement   Electronic Signatures: Valinda HoarMiller, Zadyn Yardley E (MD)  (Signed 01-Jul-14 18:50)  Authored: CHIEF COMPLAINT and HISTORY, ALLERGIES, HOME MEDICATIONS, FAMILY AND SOCIAL HISTORY, REVIEW OF SYSTEMS, PHYSICAL EXAM, ASSESSMENT AND PLAN   Last Updated: 01-Jul-14 18:50 by Valinda HoarMiller, Chue Berkovich E (MD)

## 2015-10-04 ENCOUNTER — Other Ambulatory Visit: Payer: Self-pay | Admitting: Family Medicine

## 2015-10-04 ENCOUNTER — Ambulatory Visit
Admission: RE | Admit: 2015-10-04 | Discharge: 2015-10-04 | Disposition: A | Discharge status: Home / Self Care | Payer: Medicare PPO | Source: Ambulatory Visit | Attending: Family Medicine | Admitting: Family Medicine

## 2015-10-04 DIAGNOSIS — M79605 Pain in left leg: Secondary | ICD-10-CM | POA: Diagnosis present

## 2015-10-04 DIAGNOSIS — R600 Localized edema: Secondary | ICD-10-CM | POA: Insufficient documentation

## 2017-03-19 ENCOUNTER — Other Ambulatory Visit: Payer: Self-pay | Admitting: Nurse Practitioner

## 2017-03-19 DIAGNOSIS — R1312 Dysphagia, oropharyngeal phase: Secondary | ICD-10-CM

## 2017-03-26 ENCOUNTER — Ambulatory Visit: Payer: Medicare HMO

## 2017-03-27 ENCOUNTER — Ambulatory Visit
Admission: RE | Admit: 2017-03-27 | Discharge: 2017-03-27 | Disposition: A | Discharge status: Home / Self Care | Payer: Medicare PPO | Source: Ambulatory Visit | Attending: Nurse Practitioner | Admitting: Nurse Practitioner

## 2017-03-27 DIAGNOSIS — K219 Gastro-esophageal reflux disease without esophagitis: Secondary | ICD-10-CM | POA: Insufficient documentation

## 2017-03-27 DIAGNOSIS — K224 Dyskinesia of esophagus: Secondary | ICD-10-CM | POA: Diagnosis not present

## 2017-03-27 DIAGNOSIS — R1312 Dysphagia, oropharyngeal phase: Secondary | ICD-10-CM | POA: Insufficient documentation

## 2017-03-27 DIAGNOSIS — K225 Diverticulum of esophagus, acquired: Secondary | ICD-10-CM | POA: Insufficient documentation

## 2017-12-07 ENCOUNTER — Other Ambulatory Visit: Payer: Self-pay | Admitting: Surgery

## 2017-12-07 DIAGNOSIS — M85651 Other cyst of bone, right thigh: Secondary | ICD-10-CM

## 2017-12-17 ENCOUNTER — Ambulatory Visit
Admission: RE | Admit: 2017-12-17 | Discharge: 2017-12-17 | Disposition: A | Discharge status: Home / Self Care | Payer: Medicare PPO | Source: Ambulatory Visit | Attending: Surgery | Admitting: Surgery

## 2017-12-17 DIAGNOSIS — M85651 Other cyst of bone, right thigh: Secondary | ICD-10-CM | POA: Diagnosis present

## 2017-12-17 DIAGNOSIS — S83281A Other tear of lateral meniscus, current injury, right knee, initial encounter: Secondary | ICD-10-CM | POA: Insufficient documentation

## 2017-12-17 DIAGNOSIS — M1711 Unilateral primary osteoarthritis, right knee: Secondary | ICD-10-CM | POA: Insufficient documentation

## 2017-12-17 DIAGNOSIS — S83231A Complex tear of medial meniscus, current injury, right knee, initial encounter: Secondary | ICD-10-CM | POA: Insufficient documentation

## 2017-12-17 DIAGNOSIS — X58XXXA Exposure to other specified factors, initial encounter: Secondary | ICD-10-CM | POA: Diagnosis not present

## 2017-12-17 DIAGNOSIS — M2540 Effusion, unspecified joint: Secondary | ICD-10-CM | POA: Diagnosis not present

## 2019-08-30 IMAGING — MR MR KNEE*R* W/O CM
7 series · 39 of 40 positions shown · non-contrast
Comparison: Report of radiographs dated 11/25/2017

CLINICAL DATA: Right knee pain and locking. Cystic lesion of the
medial femoral condyle seen on radiographs dated 11/25/2017.

EXAM:
MRI OF THE RIGHT KNEE WITHOUT CONTRAST
TECHNIQUE: Multiplanar, multisequence MR imaging of the knee was performed. No
intravenous contrast was administered.

[Series 3: PD fat-sat · axial · 3.0mm · 0.50mm/px · z∈[-59,+69]mm · 6 of 40 slices shown (1 of 4)]
[im 1/40]
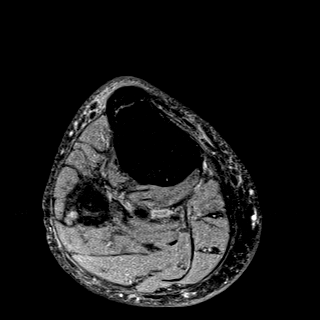
[im 8/40]
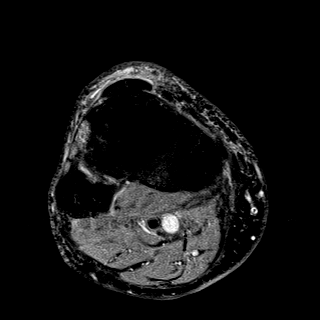
[im 16/40]
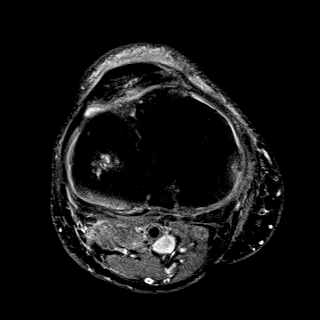
[im 24/40]
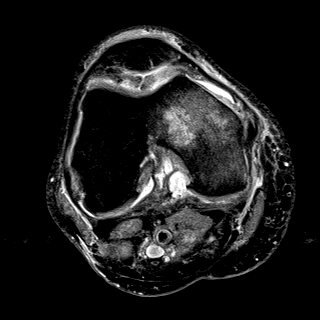
[im 32/40]
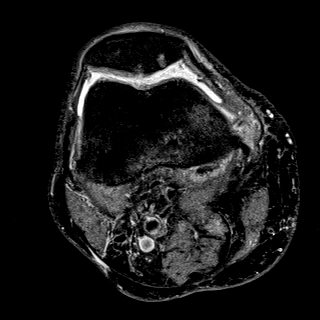
[im 40/40]
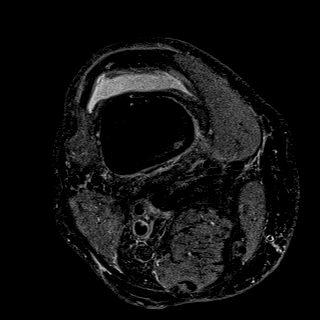

[Series 4: T1 · coronal · 3.0mm · 0.50mm/px · 6 of 33 slices shown]
[im 1/33]
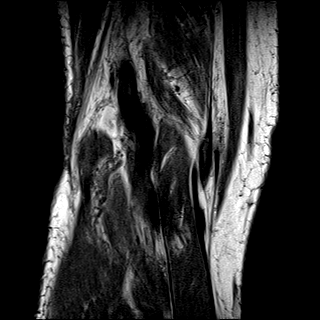
[im 7/33]
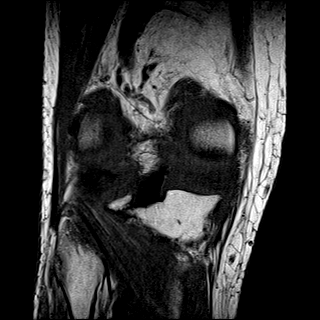
[im 13/33]
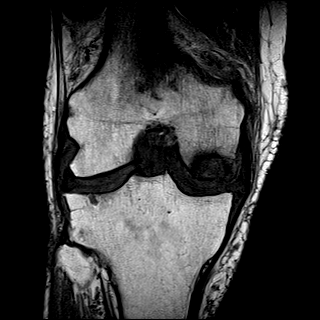
[im 20/33]
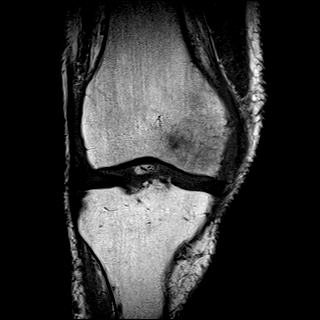
[im 26/33]
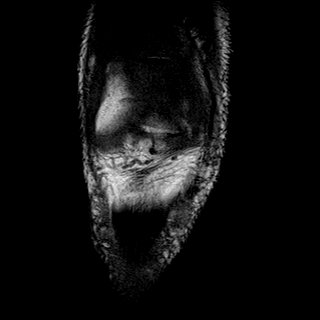
[im 33/33]
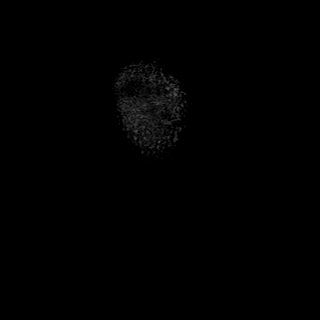

[Series 5: T2 fat-sat · coronal · 3.0mm · 0.31mm/px · 6 of 33 slices shown]
[im 1/33]
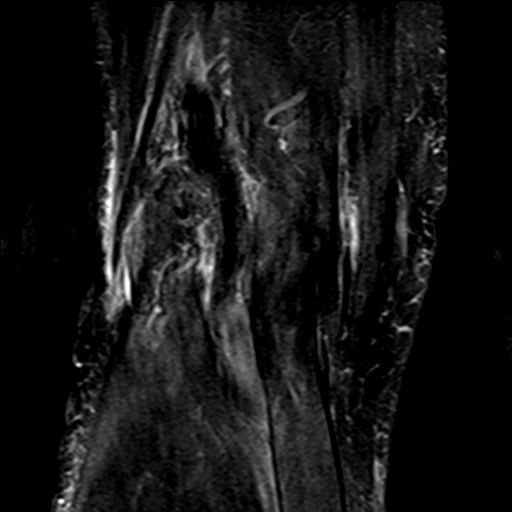
[im 7/33]
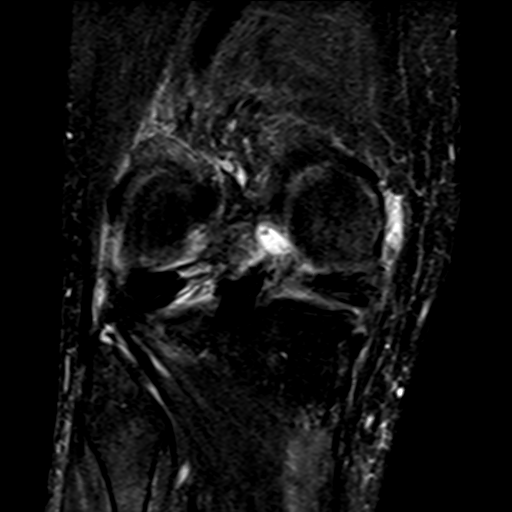
[im 13/33]
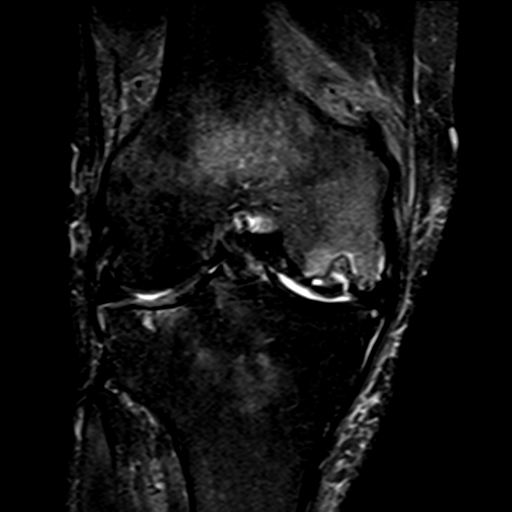
[im 20/33]
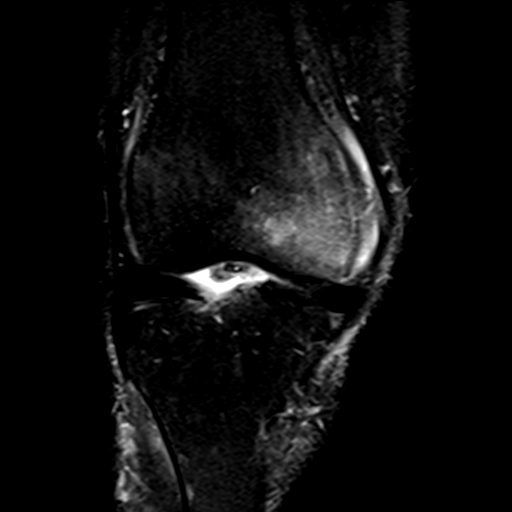
[im 26/33]
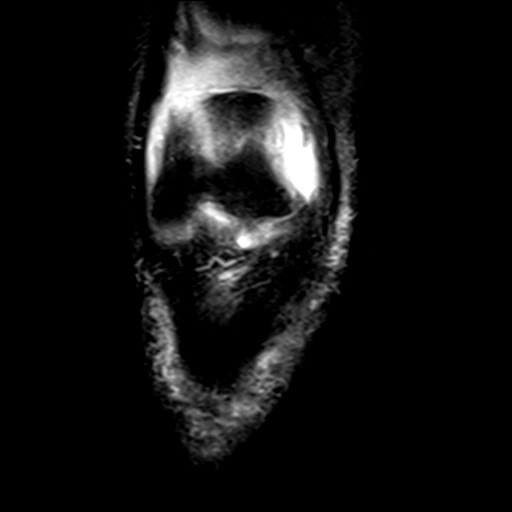
[im 33/33]
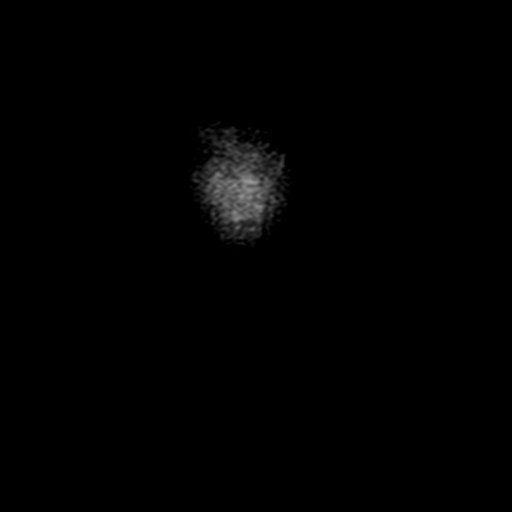

[Series 6: PD fat-sat · sagittal · 3.0mm · 0.50mm/px · 6 of 37 slices shown (2 of 4)]
[im 1/37]
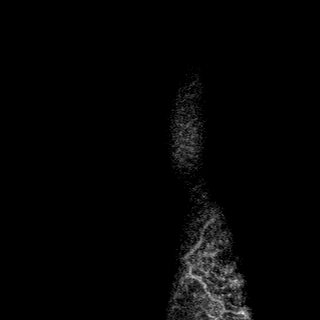
[im 8/37]
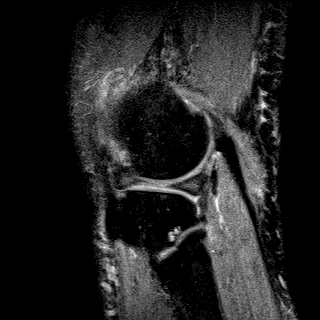
[im 15/37]
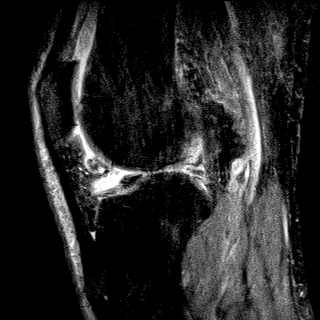
[im 22/37]
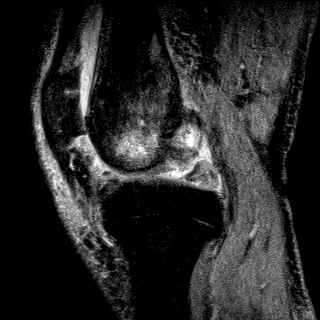
[im 29/37]
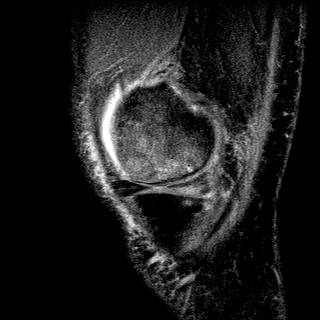
[im 37/37]
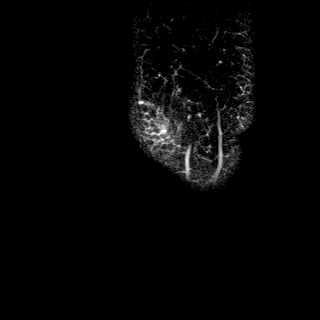

[Series 7: PD fat-sat · coronal · 3.0mm · 0.50mm/px · 6 of 33 slices shown (3 of 4)]
[im 1/33]
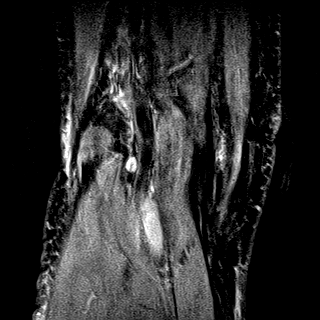
[im 7/33]
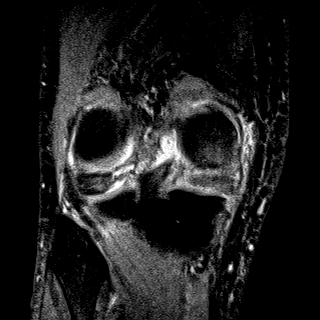
[im 13/33]
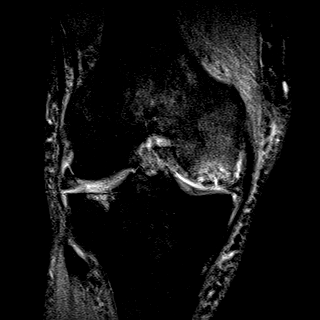
[im 20/33]
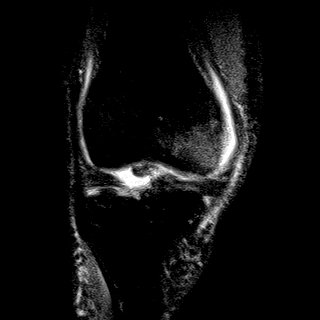
[im 26/33]
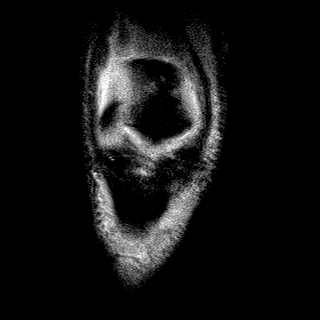
[im 33/33]
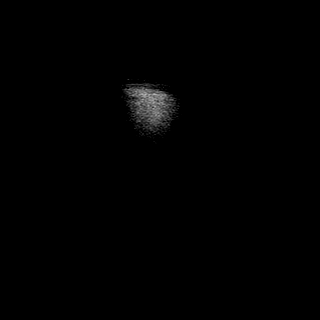

[Series 8: STIR · sagittal · 3.0mm · 0.62mm/px · 5 of 37 slices shown]
[im 1/37]
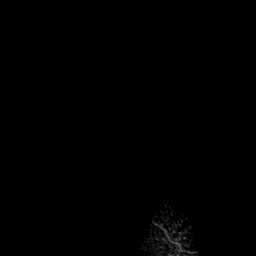
[im 8/37]
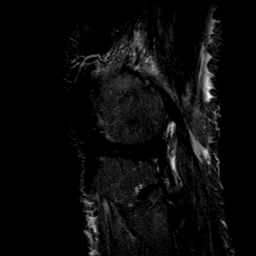
[im 15/37]
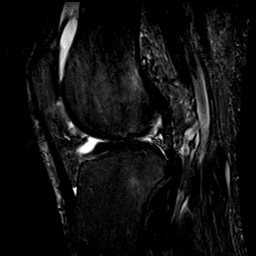
[im 22/37]
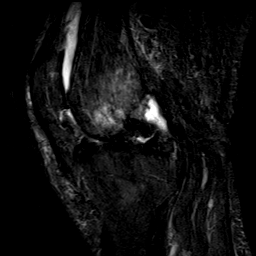
[im 29/37]
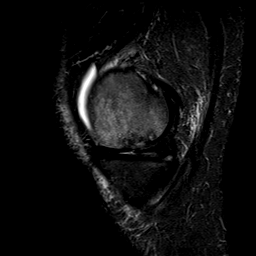

[Series 9: PD fat-sat · oblique · 2.0mm · 0.62mm/px · 4 of 21 slices shown (4 of 4)]
[im 1/21]
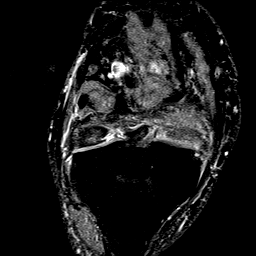
[im 7/21]
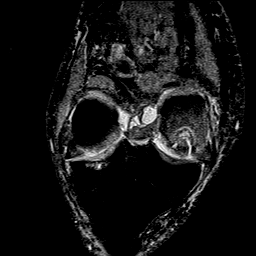
[im 14/21]
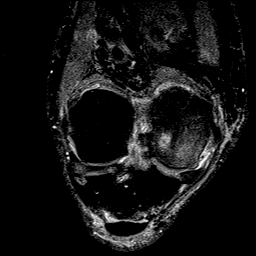
[im 21/21]
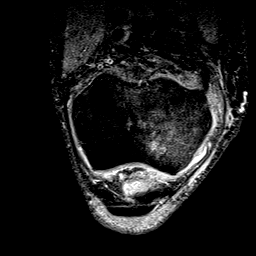

[39 of 40 positions shown; findings below may reference images not displayed]

FINDINGS: MENISCI

Medial meniscus:  There is a complex tear of the posterior horn.

Lateral meniscus: Blunting of the free edge of the midbody.
Irregular incomplete radial tear of the root of the posterior horn
of the lateral meniscus.

LIGAMENTS

Cruciates:  Normal.

Collaterals:  Normal.

CARTILAGE

Patellofemoral: Full-thickness cartilage loss on the medial facet of
the patella. Areas of partial cartilage loss and full-thickness loss
at the apex of the patella.

Medial: 2.5 x 1.7 x 1.5 cm osteochondral lesion of the medial
femoral condyle. The fragment is slightly disc placed with
undermining of the fragment with disruption of the overlying
articular cartilage.

Lateral: Tiny areas of full-thickness cartilage loss and subcortical
edema in the central portion of the lateral tibial plateau.

Joint:  Moderate joint effusion.

Popliteal Fossa:  No Baker cyst. Intact popliteus tendon.

Extensor Mechanism:  Intact quadriceps tendon and patellar tendon.

Bones: Prominent edema in the medial femoral condyle with some edema
in the adjacent soft tissues.

Other: None
IMPRESSION: 1. Findings consistent with spontaneous osteonecrosis of the knee
(SONK) with a large slightly displaced osteochondral lesion of the
medial femoral condyle.
2. Complex tear of the posterior horn of the medial meniscus.
3. Tear of the root of the posterior horn of the lateral meniscus
with a small free edge tear of the midbody.
4. Moderate joint effusion.
